# Patient Record
Sex: Female | Born: 1960 | Hispanic: No | Marital: Married | Smoking: Former smoker
Health system: Southern US, Community
[De-identification: ages and names within clinical notes are randomized; demographics above are authoritative.]

## PROBLEM LIST (undated history)

## (undated) DIAGNOSIS — Z21 Asymptomatic human immunodeficiency virus [HIV] infection status: Secondary | ICD-10-CM

## (undated) DIAGNOSIS — B20 Human immunodeficiency virus [HIV] disease: Secondary | ICD-10-CM

## (undated) DIAGNOSIS — R600 Localized edema: Secondary | ICD-10-CM

## (undated) DIAGNOSIS — F32A Depression, unspecified: Secondary | ICD-10-CM

## (undated) DIAGNOSIS — H8109 Meniere's disease, unspecified ear: Secondary | ICD-10-CM

## (undated) DIAGNOSIS — R609 Edema, unspecified: Secondary | ICD-10-CM

## (undated) DIAGNOSIS — E119 Type 2 diabetes mellitus without complications: Secondary | ICD-10-CM

## (undated) DIAGNOSIS — E7800 Pure hypercholesterolemia, unspecified: Secondary | ICD-10-CM

## (undated) HISTORY — PX: PACEMAKER PLACEMENT: SHX43

## (undated) HISTORY — PX: CATARACT EXTRACTION, BILATERAL: SHX1313

## (undated) HISTORY — DX: Pure hypercholesterolemia, unspecified: E78.00

## (undated) HISTORY — DX: Depression, unspecified: F32.A

## (undated) HISTORY — DX: Human immunodeficiency virus (HIV) disease: B20

## (undated) HISTORY — PX: UMBILICAL HERNIA REPAIR: SHX196

## (undated) HISTORY — DX: Meniere's disease, unspecified ear: H81.09

## (undated) HISTORY — DX: Localized edema: R60.0

## (undated) HISTORY — DX: Asymptomatic human immunodeficiency virus (hiv) infection status: Z21

## (undated) HISTORY — PX: OTHER SURGICAL HISTORY: SHX169

## (undated) HISTORY — DX: Edema, unspecified: R60.9

## (undated) HISTORY — PX: CHOLECYSTECTOMY: SHX55

## (undated) HISTORY — DX: Type 2 diabetes mellitus without complications: E11.9

---

## 2003-12-22 ENCOUNTER — Other Ambulatory Visit: Admission: RE | Admit: 2003-12-22 | Discharge: 2003-12-22 | Payer: Self-pay | Admitting: Family Medicine

## 2004-05-10 ENCOUNTER — Ambulatory Visit: Payer: Self-pay | Admitting: Family Medicine

## 2004-06-21 ENCOUNTER — Ambulatory Visit: Payer: Self-pay | Admitting: Family Medicine

## 2004-07-14 ENCOUNTER — Ambulatory Visit: Payer: Self-pay | Admitting: Family Medicine

## 2004-08-15 ENCOUNTER — Ambulatory Visit: Payer: Self-pay | Admitting: Family Medicine

## 2004-10-31 ENCOUNTER — Ambulatory Visit: Payer: Self-pay | Admitting: Family Medicine

## 2005-02-19 ENCOUNTER — Ambulatory Visit: Payer: Self-pay | Admitting: Family Medicine

## 2009-05-30 ENCOUNTER — Ambulatory Visit: Payer: Self-pay | Admitting: Surgery

## 2009-08-30 ENCOUNTER — Ambulatory Visit: Payer: Self-pay | Admitting: Vascular Surgery

## 2009-10-10 ENCOUNTER — Ambulatory Visit: Payer: Self-pay | Admitting: Vascular Surgery

## 2009-10-17 ENCOUNTER — Ambulatory Visit: Payer: Self-pay | Admitting: Vascular Surgery

## 2009-10-24 ENCOUNTER — Ambulatory Visit: Payer: Self-pay | Admitting: Vascular Surgery

## 2009-11-21 ENCOUNTER — Ambulatory Visit: Payer: Self-pay | Admitting: Vascular Surgery

## 2010-11-21 NOTE — Assessment & Plan Note (Signed)
OFFICE VISIT   MAYOLA, MCBAIN  DOB:  Mar 07, 1961                                       10/24/2009  CHART#:[id number]   This patient  had laser ablation of incompetent perforator (Cocketts) in  the lower third of the left leg which measured 0.62 cm in diameter; 192  joules of energy was used and post ablation ultrasound revealed no  evidence of DVT and no flow through the perforator with apparent  thrombosis.  She will return in 4 weeks for repeat venous duplex exam to  confirm closure.     Quita Skye Hart [place], M.D.  Electronically Signed   JDL/MEDQ  D:  10/24/2009  T:  10/25/2009  Job:  [id number]

## 2010-11-21 NOTE — Assessment & Plan Note (Signed)
OFFICE VISIT   Tiffany Rangel, Tiffany Rangel  DOB:  [birth year]                                       10/10/2009  EAVWU#:[id number]   Patient underwent laser ablation of her right great saphenous vein for  painful venous hypertension in the right leg.  She tolerated the  procedure well and will return in 1 week for a duplex ultrasound of her  right great saphenous system to check for closure.  She also will be  scheduled in the near future to have laser ablation of an incompetent  perforator in the lower third of the left leg, which is quite large and  causing pain and swelling.     Quita Skye Hart [place], M.D.  Electronically Signed   JDL/MEDQ  D:  10/10/2009  T:  10/11/2009  Job:  [id number]

## 2010-11-21 NOTE — Procedures (Signed)
DUPLEX DEEP VENOUS EXAM - LOWER EXTREMITY   INDICATION:  Follow up right greater saphenous vein ablation.   HISTORY:  Edema:  No.  Trauma/Surgery:  Right GSV laser ablation, 10/10/2009.  Pain:  No.  PE:  No.  Previous DVT:  No.  Anticoagulants:  No.  Other:   DUPLEX EXAM:                CFV   SFV   PopV  PTV    GSV                R  L  R  L  R  L  R   L  R  L  Thrombosis    o  o  o     o     o      +  Spontaneous   +  +  +     +     +      o  Phasic        +  +  +     +     +      o  Augmentation  +  +  +     +     +      o  Compressible  +  +  +     +     +      o  Competent     d  d  +     +     +      o   Legend:  + - yes  o - no  p - partial  D - decreased   IMPRESSION:  1. No evidence of deep venous thrombosis in the right lower extremity      or left common femoral vein.  2. Evidence of right great saphenous vein ablation without flow from      near saphenofemoral junction to mid thigh.  Right great saphenous      vein appears open, distal thigh; however, no significant flow      noted.           _____________________________  Quita Skye Hart [place], M.D.   AS/MEDQ  D:  10/17/2009  T:  10/18/2009  Job:  [id number]

## 2010-11-21 NOTE — Assessment & Plan Note (Signed)
OFFICE VISIT   Tiffany Rangel, Tiffany Rangel  DOB:  July 18, 1960                                       05/30/2009  CHART#:[id number]   REASON FOR VISIT:  Swelling.   CHIEF COMPLAINT:  Swelling.   HISTORY:  This is a 50 year old female that I am seeing at the request  of Dr. Sudie Bailey for evaluation of bilateral leg swelling.  The patient  states that she has been having problems for many years but it has  gotten worse over the last year.  Both legs swell equally.  Her biggest  complaints are up to the gaiter region.  Both ankle areas also get red  and she complains of itching which she says is relieved with the use of  Gold Bond Medicated ointment.  Her symptoms are helped or alleviated  with elevation and sleeping.  They are aggravated by prolonged standing.   The patient does suffer from diabetes since the age of 67.  She does not  require the use of insulin.  She also suffers from Meniere's disease  which is under adequate control.   REVIEW OF SYSTEMS:  Positive for reflux, constipation, arthritis, joint  pain, muscle pain, anxiety and bilateral cataracts.  All others are  negative as documented in the encounter form.   PAST MEDICAL HISTORY:  Diabetes, Meniere's disease, lower extremity  edema, hyperlipidemia, gastroesophageal reflux disease, anxiety.   PAST SURGICAL HISTORY:  C-section x3, left ear shunt, double hernia,  gallbladder surgery and deviated septum repair.   FAMILY HISTORY:  Positive for cardiovascular disease at an early age in  her father.   SOCIAL HISTORY:  She is married with 3 children.  She works as a  housewife.  She does not smoke.  She quit in 1996.  She does not drink  alcohol.   ALLERGIES:  None.   PHYSICAL EXAMINATION:  Heart rate 79, blood pressure 111/76,  temperature 98.0.  General:  She is well-appearing in no distress.  Head:  Normocephalic, atraumatic.  ENT:  Pupils equal.  Sclerae  anicteric.  Neck:  Supple.  Lungs:  Clear  bilaterally.  Cardiovascular:  Regular rate and rhythm.  No murmurs.  Palpable pedal pulses.  GI:  Abdomen is soft, obese.  No hepatosplenomegaly.  Neurology:  No focal  deficits.  Musculoskeletal:  No major deformities.  Skin:  No rashes.  Extremities:  No ulceration, minimal edema at this time.  Extremities  are well-perfused.   DIAGNOSTIC STUDIES:  I have independently reviewed her ultrasound.  This  reveals a competent deep system bilaterally.  She has reflux in her  right greater saphenous vein and an incompetent perforator in the left  down in the ankle.   ASSESSMENT/PLAN:  Bilateral swelling secondary to venous insufficiency.   PLAN:  The patient will be fitted for compression stockings today. We  will schedule her to come back in 3 months and we will discuss laser  ablation.   Jorge Ny, MD  Electronically Signed   VWB/MEDQ  D:  05/30/2009  T:  05/31/2009  Job:  [id number]

## 2010-11-21 NOTE — Procedures (Signed)
DUPLEX DEEP VENOUS EXAM - LOWER EXTREMITY   INDICATION:  Follow up left lower leg perforator vein laser ablation.   HISTORY:  Edema:  Bilateral lower extremities.  Trauma/Surgery:  Right greater saphenous vein laser ablation on  10/10/2009, left lower leg perforator vein laser ablation on 10/24/2009.  Pain:  No.  PE:  No.  Previous DVT:  No.  Anticoagulants:  Other:   DUPLEX EXAM:                CFV   SFV   PopV  PTV    GSV                R  L  R  L  R  L  R   L  R  L  Thrombosis    o  o     o     o      o     o  Spontaneous   +  +     +     +      +     +  Phasic        +  +     +     +      +     +  Augmentation  +  +     +     +      +     +  Compressible  +  +     +     +      +     +  Competent     +  +     +     +      +     +   Legend:  + - yes  o - no  p - partial  D - decreased   IMPRESSION:  1. No evidence of deep venous thrombosis noted in the left lower      extremity.  2. Patent left lower leg perforator vein noted extending off of the      left posterior tibial vein with continuous reversed flow noted.  3. No reflux was noted in the left lower extremity deep or saphenous      vein systems.    _____________________________  Quita Skye Hart [place], M.D.   CH/MEDQ  D:  11/21/2009  T:  11/21/2009  Job:  [id number]

## 2010-11-21 NOTE — Assessment & Plan Note (Signed)
OFFICE VISIT   Tiffany Rangel, Tiffany Rangel  DOB:  12/07/1960                                       10/17/2009  JYNWG#:[id number]   The patient returns 1 week post laser ablation of her right great  saphenous vein for pain and swelling in the right leg secondary to  venous hypertension.  She has noticed decrease in the swelling in her  ankle already since the procedure was done 1 week ago.  She had a mild  to moderate discomfort in the proximal thigh as one would expect that  this has subsided with her ibuprofen.  She is continuing to wear long-  leg elastic compression stocking as instructed.   PHYSICAL EXAMINATION:  VITAL SIGNS:  Blood pressure 119/75, heart rate  76, respirations 18.  Her right leg has some mild tenderness in the  proximal thigh over the saphenous vein.  There is some mild ecchymosis  in this area.  There is no edema in the right ankle.  Left leg continues  to have 1 to 2 plus edema distally.   Venous duplex exam was performed today which revealed no evidence of  deep venous obstruction with total closure of the right great saphenous  vein from the mid thigh to the saphenofemoral junction.   I think she is doing well from the standpoint of her right leg  procedure.  She will continue wearing elastic stocking for 1 week.  She  will return on April 18 for laser ablation of the incompetent perforator  distal left leg for venous hypertension.     Quita Skye Hart [place], M.D.  Electronically Signed   JDL/MEDQ  D:  10/17/2009  T:  10/18/2009  Job:  [id number]

## 2010-11-21 NOTE — Assessment & Plan Note (Signed)
OFFICE VISIT   MARISAL, SWAREY  DOB:  September 03, 1960                                       08/30/2009  EAVWU#:[id number]   The patient returns today for continued followup regarding her severe  venous insufficiency causing bilateral lower extremity swelling.  She  was evaluated by Dr. Myra Gianotti in November of this year and she was found  to have gross reflux in her right great saphenous vein from the  saphenofemoral junction to the knee as well as a large incompetent  perforator the lower third of the left leg which is an area of severe  symptomatology in the left leg.  She has severe swelling and pretibial  burning and pain as the day progresses the more she is on her feet.  This is improved by elevation but not completely relieved.  She has worn  long leg elastic compression stockings (20 mm - 30 mm gradient) over the  past 3 months with no improvement in symptoms and exacerbation of her  pain.  She has also tried analgesics (ibuprofen).   On examination today she continues to have some edema in both legs  particularly from the mid calf distally with some mild erythema in these  areas and tightness of the skin.  There are no bulging varicosities.  No  active ulcerations.  No infection.   I visualized her veins today with the SonoSite and confirmed the  findings in the vascular lab.  I think she would be best treated by the  following procedures:  1. Laser ablation of the right great saphenous vein.  2. Laser ablation of the left incompetent perforator in the lower      third of the leg which is 0.62 cm in diameter.   Today blood pressure 114/81, heart rate is 84, temperature 97.6.  Physical findings are as noted.  We will proceed with precertification  to perform this in the near future.     Quita Skye Hart [place], M.D.  Electronically Signed   JDL/MEDQ  D:  08/30/2009  T:  08/31/2009  Job:  [id number]

## 2010-11-21 NOTE — Assessment & Plan Note (Signed)
OFFICE VISIT   Tiffany, Rangel  DOB:  05-Aug-1960                                       11/21/2009  JXBJY#:[id number]   Patient returns today 4 weeks post laser ablation of an incompetent  perforating branch in the distal third of the left leg (0.62 cm in  diameter) which was performed on April 18th.  She had been having  swelling in the left leg as well as aching and throbbing and heavy  discomfort and hypersensitivity.  Since the procedure, she states her  leg is much less hypersensitive, does not ache and throb as much but  does continue to swell.  Her swelling in the right leg has persisted  following laser ablation of the right great saphenous vein but the  aching, throbbing discomfort has resolved.   She denies any chest pain, dyspnea on exertion, PND, orthopnea, chronic  anorexia, no asthma, wheezing, chronic bronchitis.  Denies any other  specific symptoms.   PHYSICAL EXAMINATION:  Blood pressure 125/84, heart rate 91,  respirations 24.  Generally, she is a female patient who is alert,  oriented x3.  She is in no apparent distress.  Neck is supple, 2+  carotid pulses.  Lower extremity exam reveals 3+ femoral, popliteal, and  dorsalis pedis pulses bilaterally.  Right leg has 0 to 1+ edema.  Left  leg has 1+ edema.  There are no stasis ulcers, hyperpigmentation,  ulceration, or varicosities noted.   Today I ordered a venous duplex exam of the left leg, which I have  reviewed and interpreted.  There is no deep venous thrombosis, but there  is patency of the incompetent perforating branch in the lower third of  the leg (Cockett's), which was treated 4 weeks ago.   There is no reflux in the left great saphenous system.   I discussed these findings with patient and do not recommend repeating  the laser treatment at this point unless her symptoms worsen.  If that  is the case, she will continue to wear long-leg elastic compression  stockings and try  all conservative measures such as elevation and  ibuprofen, and if this persists, she will return.  We may need to  attempt closure on another occasion in the left leg, otherwise will see  her on a p.r.n. basis.     Quita Skye Hart [place], M.D.  Electronically Signed   JDL/MEDQ  D:  11/21/2009  T:  11/22/2009  Job:  [id number]

## 2010-11-21 NOTE — Procedures (Signed)
LOWER EXTREMITY VENOUS REFLUX EXAM   INDICATION:  Bilateral swelling.   EXAM:  Using color-flow imaging and pulse Doppler spectral analysis, the  bilateral common femoral, superficial femoral, popliteal, posterior  tibial, greater and lesser saphenous veins are evaluated.  There is no  evidence suggesting deep venous insufficiency in the bilateral lower  extremities.   The right saphenofemoral junction is not competent with Reflux of  >527milliseconds. The right GSV is not competent with Reflux of  >570milliseconds with the caliber as described below.   The bilateral proximal short saphenous veins demonstrate competency.   GSV Diameter (used if found to be incompetent only)                                            Right    Left  Proximal Greater Saphenous Vein           0.83 cm  cm  Proximal-to-mid-thigh                     0.78 cm  cm  Mid thigh                                 0.47 cm  cm  Mid-distal thigh                          cm       cm  Distal thigh                              0.30 cm  cm  Knee                                      0.31 cm  cm   IMPRESSION:  1. Right greater saphenous vein Reflux with >563milliseconds is      identified with the caliber ranging from 0.31 cm to 0.83 cm knee to      groin.  2. The right greater saphenous vein is not aneurysmal.  3. The right greater saphenous vein is not tortuous.  4. The deep venous system is competent.  5. There is an incompetent perforator identified at the left distal      calf measuring 0.62 cm.          ___________________________________________  V. Charlena Cross, MD   CJ/MEDQ  D:  05/30/2009  T:  05/31/2009  Job:  [id number]

## 2011-04-26 ENCOUNTER — Other Ambulatory Visit: Payer: Self-pay | Admitting: Orthopedic Surgery

## 2011-04-26 ENCOUNTER — Encounter (HOSPITAL_COMMUNITY): Payer: PRIVATE HEALTH INSURANCE

## 2011-04-26 LAB — CBC
MCH: 27.5 pg (ref 26.0–34.0)
MCHC: 32.1 g/dL (ref 30.0–36.0)
MCV: 85.6 fL (ref 78.0–100.0)
Platelets: 442 10*3/uL — ABNORMAL HIGH (ref 150–400)
RBC: 4.51 MIL/uL (ref 3.87–5.11)

## 2011-04-26 LAB — URINALYSIS, ROUTINE W REFLEX MICROSCOPIC
Bilirubin Urine: NEGATIVE
Glucose, UA: NEGATIVE mg/dL
Hgb urine dipstick: NEGATIVE
Protein, ur: NEGATIVE mg/dL
Specific Gravity, Urine: 1.016 (ref 1.005–1.030)

## 2011-04-26 LAB — DIFFERENTIAL
Basophils Relative: 0 % (ref 0–1)
Eosinophils Absolute: 0.2 10*3/uL (ref 0.0–0.7)
Eosinophils Relative: 1 % (ref 0–5)
Lymphs Abs: 3.7 10*3/uL (ref 0.7–4.0)
Neutrophils Relative %: 60 % (ref 43–77)

## 2011-04-26 LAB — BASIC METABOLIC PANEL
Chloride: 96 mEq/L (ref 96–112)
GFR calc Af Amer: >90 mL/min (ref >90)
GFR calc non Af Amer: >90 mL/min (ref >90)
Potassium: 3.5 mEq/L (ref 3.5–5.1)
Sodium: 137 mEq/L (ref 135–145)

## 2011-04-26 LAB — URINE MICROSCOPIC-ADD ON

## 2011-04-26 LAB — PROTIME-INR: Prothrombin Time: 13.1 seconds (ref 11.6–15.2)

## 2011-04-26 LAB — SURGICAL PCR SCREEN: Staphylococcus aureus: NEGATIVE

## 2011-04-26 LAB — ABO/RH: ABO/RH(D): O POS

## 2011-04-30 NOTE — H&P (Signed)
Tiffany Rangel, Tiffany Rangel             ACCOUNT NO.:  [id number]  MEDICAL RECORD NO.:  [id number]  LOCATION:                               FACILITY:  [facility]  PHYSICIAN:  Madlyn Frankel. Charlann Boxer, M.D.  DATE OF BIRTH:  [birth year]  DATE OF ADMISSION:  05/01/2011 DATE OF DISCHARGE:                             HISTORY & PHYSICAL   DATE OF SURGERY:  05/01/2011  ADMITTING DIAGNOSIS:  Osteoarthritis, left hip.  HISTORY OF PRESENT ILLNESS:  This is a 50 year old lady with a history of osteoarthritis of the left hip with failure of conservative treatment to alleviate her pain.  After discussion of treatments, benefits, risks, and options, the patient is now scheduled for total hip arthroplasty by anterior approach of her left hip.  Note that she is a candidate for tranexamic acid will receive that at surgery.  Her medical doctor is Dr. Sudie Bailey.  She will be going home after surgery.  She is given her postop medicines of aspirin, iron, MiraLax,  Colace, and Robaxin.  PAST MEDICAL HISTORY:  Drug allergies:  None.  CURRENT MEDICATIONS: 1. Diazepam 5 mg 1 q.6 p.r.n. 2. Oxycodone and acetaminophen 1 tablet q.h.s. p.r.n. pain. 3. Vitamin D 3000 units once a day. 4. Calcium 600 mg once a day. 5. Citalopram 20 mg once a day. 6. Triamterene 37.5 mg/hydrochlorothiazide 25 mg 1 daily. 7. Nexium 40 mg 1 daily.  MEDICAL ILLNESSES:  Include hypertension, diet-controlled diabetes, reflux, vertigo, anxiety.  PREVIOUS SURGERIES:  Include C-section x3, double herniorrhaphy, cholecystectomy, left ear shunt, deviated septum repair,  and cataracts in both eyes.  FAMILY HISTORY:  Positive for congestive heart failure, breast cancer, and hypertension.  SOCIAL HISTORY:  The patient is married.  She is a housewife.  She does not smoke and does not drink.  REVIEW OF SYSTEMS:  CENTRAL NERVOUS SYSTEM:  Positive for vertigo and anxiety.  PULMONARY:  Negative for shortness of breath, PND, orthopnea. CARDIOVASCULAR:   Negative chest pain or palpitation.  GI:  Negative for ulcers, hepatitis.  Positive for reflux.  GU:  Negative for urinary tract difficulty.  MUSCULOSKELETAL:  Positive as in HPI.  PHYSICAL EXAMINATION:  VITAL SIGNS:  BP 132/76, respirations 16, pulse 84 and regular. GENERAL APPEARANCE:  She is well-developed, well-nourished lady, in no acute distress. HEENT:  Head, normocephalic.  Nose patent.  Ears patent.  Pupils equal, round, react to light.  Throat without injection. NECK:  Supple without adenopathy.  Carotids 2+ without bruit. CHEST:  Clear to auscultation.  No rales or rhonchi.  Respirations 16. HEART:  Regular rate and rhythm at 84 beats per minute without murmur. ABDOMEN:  Soft, active bowel sounds.  No masses or organomegaly. NEUROLOGIC:  The patient alert and oriented to time, place, and person. Cranial nerves II through XII grossly intact. EXTREMITIES:  Shows left hip with decreased range of motion with pain. NEUROVASCULAR:  Status intact.  IMPRESSION:  Osteoarthritis, left hip.  PLAN:  Total hip arthroplasty by anterior approach, left hip.     Jaquelyn Bitter. Chabon, P.A.   ______________________________ Madlyn Frankel Charlann Boxer, M.D.    SJC/MEDQ  D:  04/25/2011  T:  04/25/2011  Job:  [id number]  Electronically Signed by Jeannett Senior  CHABON P.A. on 04/26/2011 04:58:51 PM Electronically Signed by Durene Romans M.D. on 04/30/2011 12:41:10 PM

## 2011-05-01 ENCOUNTER — Inpatient Hospital Stay (HOSPITAL_COMMUNITY): Payer: PRIVATE HEALTH INSURANCE

## 2011-05-01 ENCOUNTER — Inpatient Hospital Stay (HOSPITAL_COMMUNITY)
Admission: RE | Admit: 2011-05-01 | Discharge: 2011-05-03 | DRG: 470 | Disposition: A | Discharge status: Home Health | Payer: PRIVATE HEALTH INSURANCE | Source: Ambulatory Visit | Attending: Orthopedic Surgery | Admitting: Orthopedic Surgery

## 2011-05-01 DIAGNOSIS — M169 Osteoarthritis of hip, unspecified: Principal | ICD-10-CM | POA: Diagnosis present

## 2011-05-01 DIAGNOSIS — K219 Gastro-esophageal reflux disease without esophagitis: Secondary | ICD-10-CM | POA: Diagnosis present

## 2011-05-01 DIAGNOSIS — G4733 Obstructive sleep apnea (adult) (pediatric): Secondary | ICD-10-CM | POA: Diagnosis present

## 2011-05-01 DIAGNOSIS — I10 Essential (primary) hypertension: Secondary | ICD-10-CM | POA: Diagnosis present

## 2011-05-01 DIAGNOSIS — Z79899 Other long term (current) drug therapy: Secondary | ICD-10-CM

## 2011-05-01 DIAGNOSIS — Z01812 Encounter for preprocedural laboratory examination: Secondary | ICD-10-CM

## 2011-05-01 DIAGNOSIS — R42 Dizziness and giddiness: Secondary | ICD-10-CM | POA: Diagnosis present

## 2011-05-01 DIAGNOSIS — F411 Generalized anxiety disorder: Secondary | ICD-10-CM | POA: Diagnosis present

## 2011-05-01 DIAGNOSIS — M1610 Unilateral primary osteoarthritis, unspecified hip: Principal | ICD-10-CM | POA: Diagnosis present

## 2011-05-01 DIAGNOSIS — E119 Type 2 diabetes mellitus without complications: Secondary | ICD-10-CM | POA: Diagnosis present

## 2011-05-01 LAB — TYPE AND SCREEN
ABO/RH(D): O POS
Antibody Screen: NEGATIVE

## 2011-05-01 LAB — GLUCOSE, CAPILLARY: Glucose-Capillary: 123 mg/dL — ABNORMAL HIGH (ref 70–99)

## 2011-05-02 LAB — CBC
MCH: 28 pg (ref 26.0–34.0)
Platelets: 314 10*3/uL (ref 150–400)
RBC: 3.47 MIL/uL — ABNORMAL LOW (ref 3.87–5.11)
WBC: 10.4 10*3/uL (ref 4.0–10.5)

## 2011-05-02 LAB — BASIC METABOLIC PANEL
CO2: 31 mEq/L (ref 19–32)
Calcium: 8.8 mg/dL (ref 8.4–10.5)
GFR calc Af Amer: >90 mL/min (ref >90)
Sodium: 136 mEq/L (ref 135–145)

## 2011-05-03 LAB — CBC
MCV: 87.4 fL (ref 78.0–100.0)
Platelets: 331 10*3/uL (ref 150–400)
RBC: 3.5 MIL/uL — ABNORMAL LOW (ref 3.87–5.11)
WBC: 11.5 10*3/uL — ABNORMAL HIGH (ref 4.0–10.5)

## 2011-05-03 LAB — BASIC METABOLIC PANEL
CO2: 34 mEq/L — ABNORMAL HIGH (ref 19–32)
Chloride: 95 mEq/L — ABNORMAL LOW (ref 96–112)
Creatinine, Ser: 0.67 mg/dL (ref 0.50–1.10)
Potassium: 3.2 mEq/L — ABNORMAL LOW (ref 3.5–5.1)
Sodium: 136 mEq/L (ref 135–145)

## 2011-05-07 NOTE — Op Note (Signed)
Tiffany Rangel             ACCOUNT NO.:  [id number]  MEDICAL RECORD NO.:  [id number]  LOCATION:  1604                         FACILITY:  [facility]  PHYSICIAN:  Tiffany Frankel. Charlann Rangel, M.D.  DATE OF BIRTH:  Jul 07, 1961  DATE OF PROCEDURE:  05/01/2011 DATE OF DISCHARGE:                              OPERATIVE REPORT   PREOPERATIVE DIAGNOSIS:  Left hip osteoarthritis.  POSTOPERATIVE DIAGNOSIS:  Left hip osteoarthritis.  PROCEDURE:  Left total hip replacement through an anterior approach utilizing DePuy component, size 50 pinnacle cup, 32+ 1 neutral Ultrex liner, a size 4 high Tri-Lock stem high with a 32+ 1.5 delta ceramic ball.  SURGEON:  Tiffany Frankel. Charlann Rangel, M.D.  ASSISTANT:  Lanney Gins, PA  Please note that Lanney Gins, physician assistant was present for the entire case with preoperative positioning to perioperative retractor management and support in addition to general facilitation of the case and primary wound closure. ANESTHESIA:  General.  SPECIMENS:  None.  BLOOD LOSS:  450 cc.  DRAINS:  One Hemovac.  COMPLICATION:  None.  The patient was stable to recovery room in good condition.  INDICATION FOR PROCEDURE:  Tiffany Rangel is a 50 year old female, who presents office for left hip pain.  Radiographs revealed that she had end-stage bone-on-bone changes.  She had failed to respond conservative measures.  After reviewing risks and benefits of the procedure, she wished to proceed with more definitive measures.  Specific risk of infection, DVT, component failure, dislocation, need for revision surgery all discussed and reviewed.  Consent was obtained for the benefit of pain relief.  Anterior approach was chosen after reviewing the pros and cons of this versus posterior approach.  PROCEDURE IN DETAIL:  The patient was brought to operative theater. Once adequate anesthesia, preoperative antibiotics, Ancef administered, the patient was positioned supine on the OSI  Hana table.  Once adequately positioned with the left arm adducted across her body and her abdomen taped to the right side to allow her adequate exposure, the left hip was pre-draped out with 10-15 drapes.  Fluoroscopy was used to confirm orientation of pelvis and positioning.  The left hip from proximal to the iliac crest to the mid thigh was prepped and draped in a sterile fashion.  Landmarks were identified, particularly the anterior and superior iliac spine and the greater trochanter.  Time-out was performed identifying the patient, planned procedure, and extremity.  An incision was then made 2 cm distal and lateral to the anterior superior iliac spine extending in line and orientation of the tensor fascia lata muscle.  Sharp dissection was carried down to the tensor fascia lata muscle.  Protractor was replaced.  Fascia was then incised. Muscle belly was swept laterally and retractor placed along the superior neck.  Following cauterization of circumflex vessels and as well as removing pericapsular fat, a second retractor placed along the inferior neck.  A third retractor was placed underneath the anterior rectus over the anterior wall of acetabulum.  A capsulotomy was then made along the superior neck extending the trochanteric fossa and then down toward the lesser trochanter.  Stay sutures were placed and retractors placed intracapsular.  At this point, traction was applied.  Fluoroscopy was used  to confirm the orientation and the length of the neck cut.  The neck osteotomy was then made and the femoral head removed without difficulty or complication.  At this point, the traction was removed and retractor placed along the posterior acetabulum and then anteriorly after removing all of the remaining foveal and soft tissues.  They were invaginated into the acetabulum.  I began reaming with a 44 reamer and reamed up to a 49 reamer.  The last reaming was done under fluoroscopy  to confirm the depth of reaming and orientation.  The final 50 cup was chosen and impacted under fluoroscopic imaging to confirm the amount of abduction.  Palpably, the acetabular splint was positioned anatomically.  A single cancellous screws were placed in the ilium.  A hole eliminator was placed.  The final 32+ 4 liner was then impacted.  At this point, the hip was rolled to 80 degrees.  The capsule was removed off the inferior neck.  The lateral hook was placed and manual traction lifted the femur up and the bed held into position.  The leg was then extended and adducted.  A second retractor placed over the posterior trochanter pushing the gluteus away.  Once the proximal femur was exposed posteriorly box osteotome was used to set orientation followed by began broaching with a starting broach by hand.  I then broached up to a size 3 initially.  I did a trial reduction with a standard neck initially.  A trial reduction revealed that probably it would go up 1 size in broach.  In addition, it appeared that had less offset near the side with a standard neck.  Leg lengths, however, appeared to be very close.  Given these findings, I replaced retractors in the lateral hook, dislocated the hip, removed the trial components and broached up to a size 4.  I chose a 4 high Tri-Lock stem. Now, this final stem was then impacted.  A trial reduction was carried out with 32+ 1 ball.  With this, the leg lengths were right on comparing the ischium to the lesser trochanter and in addition, offset that was improved.  I chose a 32+ 1 delta ceramic ball, which was impacted onto clean and dry trunnion and the hip was reduced.  The hip had been irrigated throughout the case and again this point.  I reapproximated the capsule over the anterior aspect the hip.  A medium Hemovac drain was placed deep.  The fascia of the tensor fascia lata muscle was then reapproximated using #1 Vicryl.  The remaining  wound was closed with 2-0 Vicryl and running 4-0 Monocryl.  The hip was then cleaned, dried, and dressed sterilely using Dermabond and Aquacel dressing.  Drain site dressed separately.  The patient was then brought to the recovery room in stable condition, tolerating the procedure well.     Tiffany Rangel, M.D.     MDO/MEDQ  D:  05/01/2011  T:  05/02/2011  Job:  [id number]  cc:   Philemon Kingdom, MD Fax: [phone]  Electronically Signed by Durene Romans M.D. on 05/07/2011 09:15:25 AM

## 2011-05-11 NOTE — Discharge Summary (Signed)
Tiffany Rangel, Tiffany Rangel             ACCOUNT NO.:  [id number]  MEDICAL RECORD NO.:  [id number]  LOCATION:  1604                         FACILITY:  [facility]  PHYSICIAN:  Lanney Gins, PA     DATE OF BIRTH:  February 09, 1961  DATE OF ADMISSION:  05/01/2011 DATE OF DISCHARGE:  05/03/2011                              DISCHARGE SUMMARY   PROCEDURE:  Left total hip arthroplasty anterior approach.  ATTENDING PHYSICIAN:  Madlyn Frankel. Charlann Boxer, M.D.  ADMITTING DIAGNOSIS:  Osteoarthritis, left hip.  DISCHARGE DIAGNOSES: 1. Status post left total hip arthroplasty, anterior approach. 2. Hypertension. 3. Diet-controlled diabetes. 4. Reflux. 5. Vertigo. 6. Anxiety.  HISTORY OF PRESENT ILLNESS:  The patient is a 50 year old lady with a history of osteoarthritis of the left knee with failure of conservative treatment to alleviate her pain.  X-rays in the clinic do show arthritic changes of the left hip.  Various options were discussed with the patient.  The patient wished to proceed with surgery.  Risks, benefits, and expectation of procedure were discussed with the patient.  The patient understands risks, benefits, and expectations and wishes to proceed with left total hip arthroplasty per Dr. Charlann Boxer.  HOSPITAL COURSE:  The patient underwent the above-stated procedure on May 01, 2011.  The patient tolerated the procedure well, was brought to the recovery room in good condition and subsequently to the floor.  Postop day #1, May 02, 2011, the patient doing well.  No events. Pain okay, controlled with medicines.  Afebrile.  Vital signs stable.  H and H 9.7/30.1.  She is distally neurovascular intact.  Dressing is good.  The patient did well with physical therapy.  Hemovac drain was taken out.  IV was changed to saline lock.  Postop day #2, May 03, 2011, the patient doing well.  No events. Pain is well controlled with pain medicine.  She is afebrile.  Vital signs stable.  H and H are  9.7/30.6.  Dressings good.  The patient did well with physical therapy x2.  It was felt that the patient was doing well enough to be discharged home.  DISCHARGE CONDITION:  Good.  DISCHARGE INSTRUCTIONS:  The patient will be discharged home with home health PT and OT.  The patient will be weightbearing as tolerated.  She will maintain her surgical dressing for about 8 days, after which time, she will replace with gauze and tape.  The patient keep the area dry and clean until followup.  The patient will follow up at [facility] in 2 weeks.  The patient is to call with any questions or concerns.  DISCHARGE MEDICATIONS: 1. Aspirin enteric-coated 325 mg 1 p.o. b.i.d. x4 weeks. 2. Tylenol 325 mg 1-2 p.o. q.4 hours p.r.n. pain. 3. Benadryl 25 mg 1 p.o. q.4 hours p.r.n. 4. Colace 100 mg 1 p.o. b.i.d. constipation. 5. Iron sulfate 325 mg 1 p.o. t.i.d. x2-3 weeks. 6. Robaxin 500 mg 1 p.o. every 6 hours p.r.n. muscle spasm. 7. Oxycodone 5 mg IR 1-3 p.o. q.4-6 hours p.r.n. pain. 8. MiraLax 17 g p.o. b.i.d. constipation. 9. Calcium carbonate 600 mg p.o. daily. 10.Diazepam 5 mg 1 p.o. daily p.r.n. anxiety. 11.Lexapro 20 mg 1 p.o. q.a.m. 12.Multivitamin 1 p.o.  daily. 13.Nexium 40 mg 1 p.o. daily. 14.Triamterene/HCTZ 37.5/25 mg 1 p.o. q.a.m. 15.Vitamin D3 1000 units 1 p.o. daily.          ______________________________ Lanney Gins, PA     MB/MEDQ  D:  05/03/2011  T:  05/03/2011  Job:  [id number]  Electronically Signed by Lanney Gins PA on 05/08/2011 02:16:01 PM Electronically Signed by Durene Romans M.D. on 05/11/2011 04:07:58 PM

## 2013-06-21 IMAGING — CR DG HIP 1V PORT*L*
1 series · 1 of 1 positions shown · non-contrast
Comparison: Portable cross-table lateral view 9007 hours compared
to portable pelvic radiograph 05/01/2011

CLINICAL DATA: Left total hip arthroplasty

PORTABLE LEFT HIP - 1 VIEW

[AP]
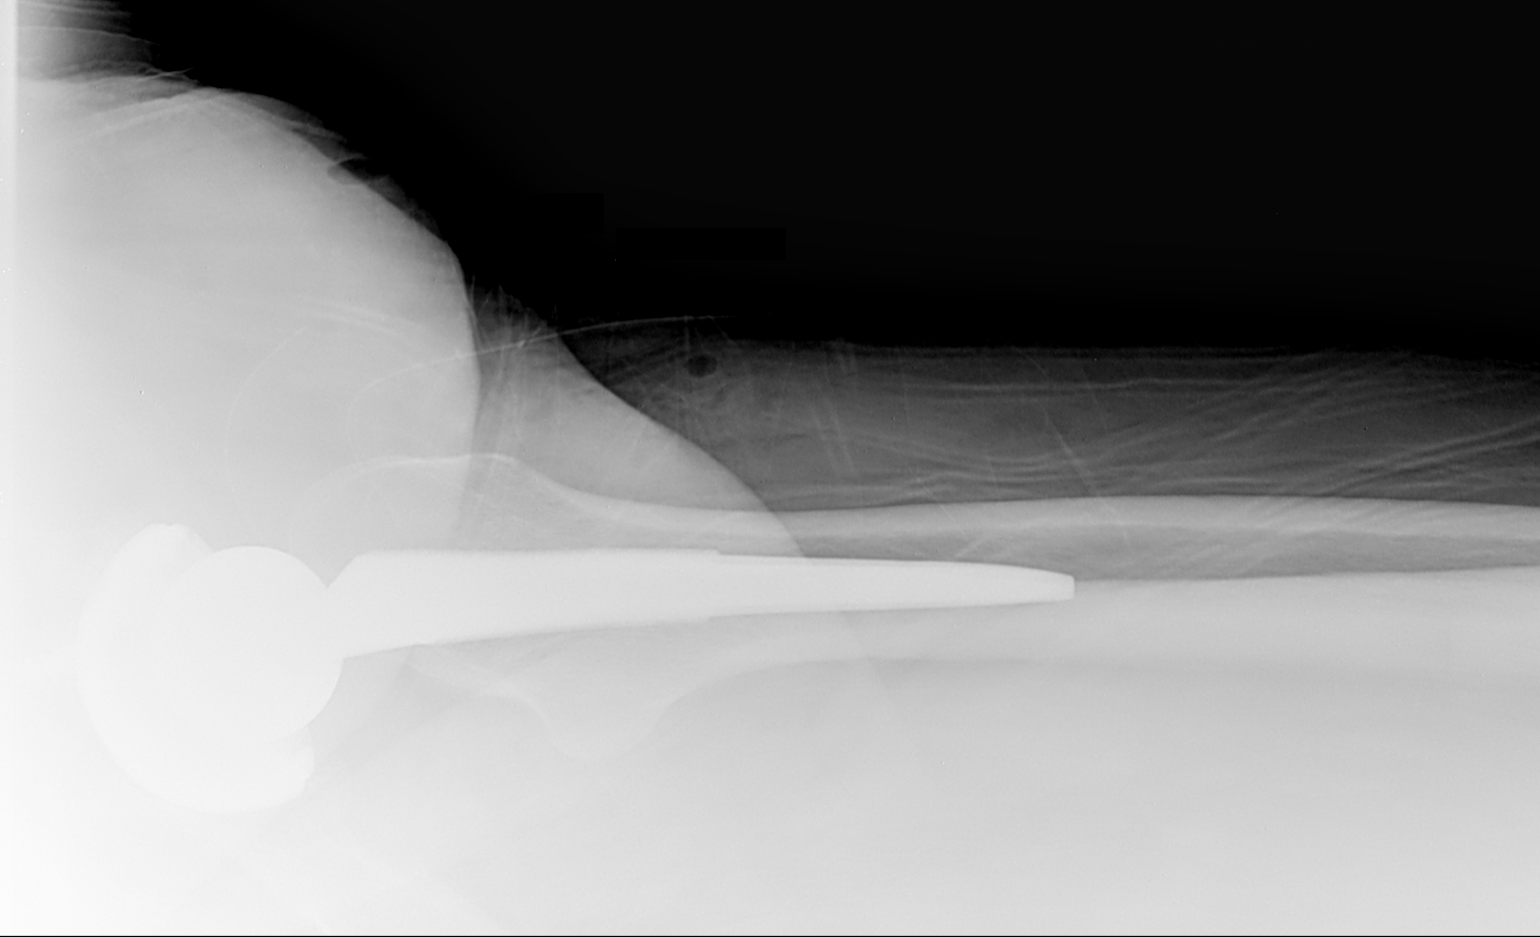

[1 of 1 positions shown; findings below may reference images not displayed]

FINDINGS: Components of left hip arthroplasty are identified in expected
positions.
Acetabulum and pelvis are poorly visualized on this cross-table
lateral exam due to light technique at the pelvis.
No acute fracture, dislocation or bone destruction.
Anterior surgical drain identified.
IMPRESSION: Left hip prosthesis without acute complication.

## 2013-06-21 IMAGING — CR DG PORTABLE PELVIS
1 series · 1 of 1 positions shown · non-contrast
Comparison: None.

CLINICAL DATA: Left total hip replacement

PORTABLE PELVIS

[AP]
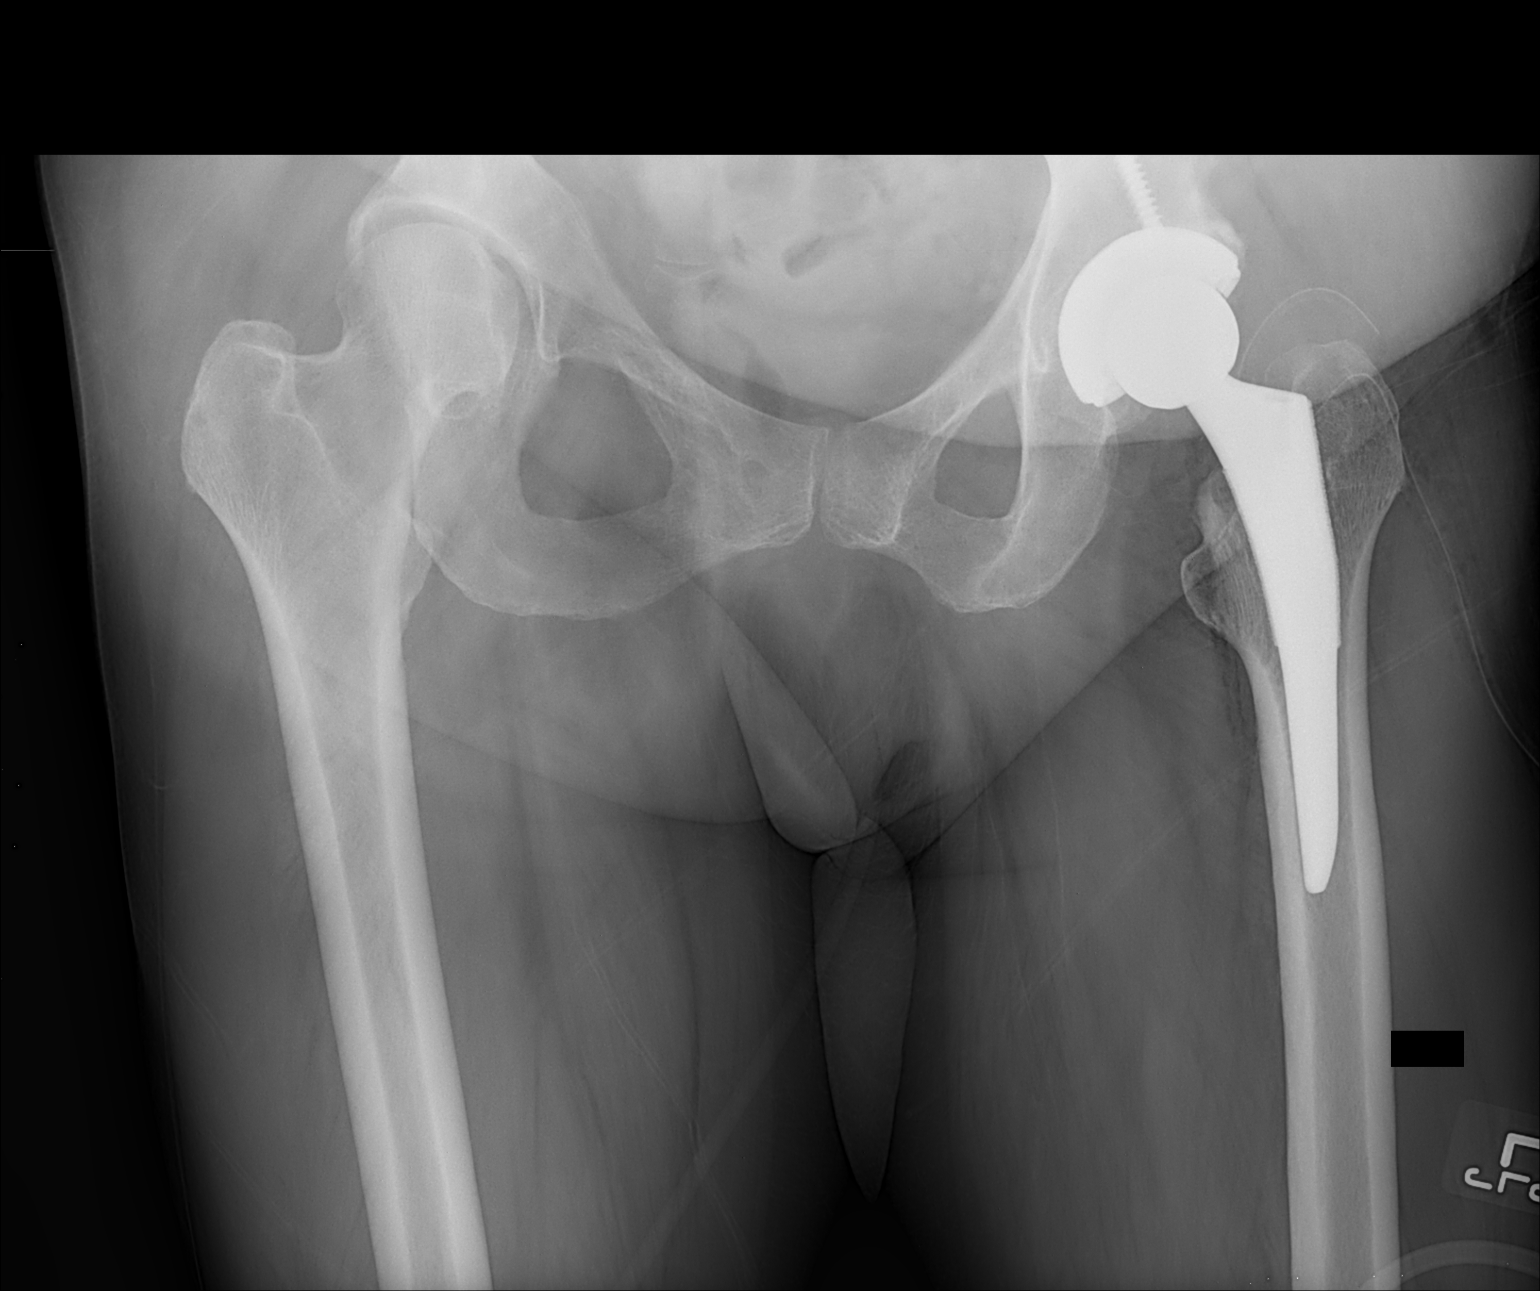

[1 of 1 positions shown; findings below may reference images not displayed]

FINDINGS: Left total hip arthroplasty noted.  The femoral component
and acetabular component appear well seated.  Surgical drain in
place.  There is narrowing of the right hip joint space.
IMPRESSION: Left hip total arthroplasty without complication.

## 2013-07-22 DIAGNOSIS — B20 Human immunodeficiency virus [HIV] disease: Secondary | ICD-10-CM

## 2013-08-28 DIAGNOSIS — B20 Human immunodeficiency virus [HIV] disease: Secondary | ICD-10-CM

## 2013-09-23 DIAGNOSIS — B20 Human immunodeficiency virus [HIV] disease: Secondary | ICD-10-CM

## 2013-11-02 DIAGNOSIS — B20 Human immunodeficiency virus [HIV] disease: Secondary | ICD-10-CM

## 2014-02-04 DIAGNOSIS — B20 Human immunodeficiency virus [HIV] disease: Secondary | ICD-10-CM

## 2014-10-15 ENCOUNTER — Other Ambulatory Visit: Payer: Self-pay | Admitting: Internal Medicine

## 2014-12-10 DIAGNOSIS — B20 Human immunodeficiency virus [HIV] disease: Secondary | ICD-10-CM | POA: Diagnosis not present

## 2015-05-20 DIAGNOSIS — B20 Human immunodeficiency virus [HIV] disease: Secondary | ICD-10-CM | POA: Diagnosis not present

## 2015-07-03 ENCOUNTER — Other Ambulatory Visit: Payer: Self-pay | Admitting: Infectious Diseases

## 2015-07-13 ENCOUNTER — Other Ambulatory Visit: Payer: Self-pay | Admitting: Infectious Diseases

## 2016-01-04 ENCOUNTER — Other Ambulatory Visit: Payer: Self-pay | Admitting: Infectious Diseases

## 2016-01-11 ENCOUNTER — Other Ambulatory Visit: Payer: Self-pay | Admitting: Infectious Diseases

## 2016-07-08 ENCOUNTER — Other Ambulatory Visit: Payer: Self-pay | Admitting: Infectious Diseases

## 2016-07-16 ENCOUNTER — Other Ambulatory Visit: Payer: Self-pay | Admitting: Infectious Diseases

## 2016-10-11 ENCOUNTER — Other Ambulatory Visit: Payer: Self-pay | Admitting: Infectious Diseases

## 2018-07-28 DIAGNOSIS — I10 Essential (primary) hypertension: Secondary | ICD-10-CM

## 2018-07-28 DIAGNOSIS — R7989 Other specified abnormal findings of blood chemistry: Secondary | ICD-10-CM

## 2018-07-28 DIAGNOSIS — Z950 Presence of cardiac pacemaker: Secondary | ICD-10-CM

## 2018-07-28 DIAGNOSIS — E119 Type 2 diabetes mellitus without complications: Secondary | ICD-10-CM

## 2018-07-28 DIAGNOSIS — I442 Atrioventricular block, complete: Secondary | ICD-10-CM

## 2018-07-28 DIAGNOSIS — K219 Gastro-esophageal reflux disease without esophagitis: Secondary | ICD-10-CM

## 2018-07-28 DIAGNOSIS — H8109 Meniere's disease, unspecified ear: Secondary | ICD-10-CM

## 2018-07-28 DIAGNOSIS — Z7984 Long term (current) use of oral hypoglycemic drugs: Secondary | ICD-10-CM

## 2018-07-28 DIAGNOSIS — Z96649 Presence of unspecified artificial hip joint: Secondary | ICD-10-CM

## 2018-07-28 DIAGNOSIS — B20 Human immunodeficiency virus [HIV] disease: Secondary | ICD-10-CM

## 2018-07-28 DIAGNOSIS — D649 Anemia, unspecified: Secondary | ICD-10-CM

## 2018-07-28 DIAGNOSIS — Z79899 Other long term (current) drug therapy: Secondary | ICD-10-CM

## 2018-08-01 DIAGNOSIS — D509 Iron deficiency anemia, unspecified: Secondary | ICD-10-CM

## 2018-09-01 DIAGNOSIS — D509 Iron deficiency anemia, unspecified: Secondary | ICD-10-CM

## 2019-03-02 DIAGNOSIS — D509 Iron deficiency anemia, unspecified: Secondary | ICD-10-CM

## 2019-11-12 DIAGNOSIS — D649 Anemia, unspecified: Secondary | ICD-10-CM

## 2020-05-06 ENCOUNTER — Other Ambulatory Visit: Payer: Self-pay | Admitting: Hematology and Oncology

## 2020-05-06 DIAGNOSIS — D649 Anemia, unspecified: Secondary | ICD-10-CM

## 2020-05-13 ENCOUNTER — Telehealth: Payer: Self-pay | Admitting: Oncology

## 2020-05-13 NOTE — Telephone Encounter (Signed)
[place] to Rescheduled from 11/8 Labs, Follow up to 11/30

## 2020-05-16 ENCOUNTER — Ambulatory Visit: Payer: BC Managed Care – PPO | Admitting: Oncology

## 2020-05-16 ENCOUNTER — Other Ambulatory Visit: Payer: BC Managed Care – PPO

## 2020-06-06 ENCOUNTER — Other Ambulatory Visit: Payer: Self-pay | Admitting: Oncology

## 2020-06-06 DIAGNOSIS — D649 Anemia, unspecified: Secondary | ICD-10-CM

## 2020-06-06 NOTE — Progress Notes (Signed)
[facility]  [address] [place],  [place]  [address] [phone]  Clinic Day:  06/07/2020  Referring physician: Philemon Kingdom, MD   HISTORY OF PRESENT ILLNESS:  The patient is a 59 y.o. female with mild anemia.  Labs done earlier this year showed borderline low iron levels to where I recommended that she take 1-2 iron tablets daily.  She comes in today to reassess her anemia.  Since her last visit, the patient has been doing well.  She has baseline fatigue, but denies having any overt forms of blood which concern her for progressive anemia.  PHYSICAL EXAM:  Blood pressure 128/60, pulse 73, temperature 98.5 F (36.9 C), resp. rate 16, height 5\' 10"  (1.778 m), weight 178 lb 6.4 oz (80.9 kg), SpO2 95 %. Wt Readings from Last 3 Encounters:  06/07/20 178 lb 6.4 oz (80.9 kg)   Body mass index is 25.6 kg/m. Performance status (ECOG): 0 Physical Exam Constitutional:      Appearance: Normal appearance. She is not ill-appearing.  HENT:     Mouth/Throat:     Mouth: Mucous membranes are moist.     Pharynx: Oropharynx is clear. No oropharyngeal exudate or posterior oropharyngeal erythema.  Cardiovascular:     Rate and Rhythm: Normal rate and regular rhythm.     Heart sounds: No murmur heard.  No friction rub. No gallop.   Pulmonary:     Effort: Pulmonary effort is normal. No respiratory distress.     Breath sounds: Normal breath sounds. No wheezing, rhonchi or rales.  Abdominal:     General: Bowel sounds are normal. There is no distension.     Palpations: Abdomen is soft. There is no mass.     Tenderness: There is no abdominal tenderness.  Musculoskeletal:        General: No swelling.     Right lower leg: No edema.     Left lower leg: No edema.  Lymphadenopathy:     Cervical: No cervical adenopathy.     Upper Body:     Right upper body: No supraclavicular or axillary adenopathy.     Left upper body: No supraclavicular or axillary  adenopathy.     Lower Body: No right inguinal adenopathy. No left inguinal adenopathy.  Skin:    General: Skin is warm.     Coloration: Skin is not jaundiced.     Findings: No lesion or rash.  Neurological:     General: No focal deficit present.     Mental Status: She is alert and oriented to person, place, and time. Mental status is at baseline.     Cranial Nerves: Cranial nerves are intact.  Psychiatric:        Mood and Affect: Mood normal.        Behavior: Behavior normal.        Thought Content: Thought content normal.     LABS:   CBC Latest Ref Rng & Units 06/07/2020 05/03/2011 05/02/2011  WBC - 6.4 11.5(H) 10.4  Hemoglobin 12.0 - 16.0 11.1(A) 9.7(L) 9.7(L)  Hematocrit 36 - 46 34(A) 30.6(L) 30.1(L)  Platelets 150 - 399 373 331 314   CMP Latest Ref Rng & Units 06/07/2020 05/03/2011 05/02/2011  Glucose 70 - 99 mg/dL - 05/04/2011) 914(N)  BUN 4 - 21 13 4(L) 6  Creatinine 0.5 - 1.1 1.0 0.67 0.72  Sodium 137 - 147 138 136 136  Potassium 3.4 - 5.3 3.6 3.2(L) 4.0  Chloride 99 - 108 99 95(L)  100  CO2 13 - 22 33(A) 34(H) 31  Calcium 8.7 - 10.7 9.5 8.6 8.8  Alkaline Phos 25 - 125 102 - -  AST 13 - 35 25 - -  ALT 7 - 35 20 - -       ASSESSMENT & PLAN:   Assessment/Plan:  A 59 y.o. female with mild anemia, with borderline low iron levels being seen in the past.  Based upon her labs today, there have been no significant changes in her hematologic parameters.  Clinically, she is doing well.  I will see her back in 6 months for repeat clinical assessment.  The patient understands all the plans discussed today and is in agreement with them.      Leela Vanbrocklin Kirby Funk, MD

## 2020-06-07 ENCOUNTER — Inpatient Hospital Stay (INDEPENDENT_AMBULATORY_CARE_PROVIDER_SITE_OTHER): Payer: BC Managed Care – PPO | Admitting: Oncology

## 2020-06-07 ENCOUNTER — Other Ambulatory Visit: Payer: Self-pay

## 2020-06-07 ENCOUNTER — Other Ambulatory Visit: Payer: Self-pay | Admitting: Oncology

## 2020-06-07 ENCOUNTER — Other Ambulatory Visit: Payer: Self-pay | Admitting: Hematology and Oncology

## 2020-06-07 ENCOUNTER — Encounter: Payer: Self-pay | Admitting: Oncology

## 2020-06-07 ENCOUNTER — Inpatient Hospital Stay: Payer: BC Managed Care – PPO | Attending: Oncology

## 2020-06-07 ENCOUNTER — Telehealth: Payer: Self-pay | Admitting: Oncology

## 2020-06-07 VITALS — BP 128/60 | HR 73 | Temp 98.5°F | Resp 16 | Ht 70.0 in | Wt 178.4 lb

## 2020-06-07 DIAGNOSIS — D649 Anemia, unspecified: Secondary | ICD-10-CM | POA: Diagnosis not present

## 2020-06-07 LAB — BASIC METABOLIC PANEL
BUN: 13 (ref 4–21)
CO2: 33 — AB (ref 13–22)
Chloride: 99 (ref 99–108)
Creatinine: 1 (ref 0.5–1.1)
Glucose: 163
Potassium: 3.6 (ref 3.4–5.3)
Sodium: 138 (ref 137–147)

## 2020-06-07 LAB — HEPATIC FUNCTION PANEL
ALT: 20 (ref 7–35)
AST: 25 (ref 13–35)
Alkaline Phosphatase: 102 (ref 25–125)
Bilirubin, Total: 0.3

## 2020-06-07 LAB — COMPREHENSIVE METABOLIC PANEL
Albumin: 4.1 (ref 3.5–5.0)
Calcium: 9.5 (ref 8.7–10.7)

## 2020-06-07 LAB — CBC AND DIFFERENTIAL
HCT: 34 — AB (ref 36–46)
Hemoglobin: 11.1 — AB (ref 12.0–16.0)
Neutrophils Absolute: 3.14
Platelets: 373 (ref 150–399)
WBC: 6.4

## 2020-06-07 LAB — CBC
MCV: 93 (ref 76–111)
RBC: 3.59 — AB (ref 3.87–5.11)

## 2020-06-07 NOTE — Telephone Encounter (Signed)
Per 11/30 LOS, patient scheduled for May 2022 Appt's.  Gave Patient Appt Summary

## 2020-07-06 ENCOUNTER — Encounter: Payer: Self-pay | Admitting: Oncology

## 2020-11-29 NOTE — Progress Notes (Incomplete)
[facility]  [address] [place],  [place]  [address] [phone]  Clinic Day:  11/29/2020  Referring physician: Philemon Kingdom, MD  This document serves as a record of services personally performed by Weston Settle, MD. It was created on their behalf by Curry,Lauren E, a trained medical scribe. The creation of this record is based on the scribe's personal observations and the provider's statements to them.  HISTORY OF PRESENT ILLNESS:  The patient is a 60 y.o. female with mild anemia.  Labs done earlier this year showed borderline low iron levels to where I recommended that she take 1-2 iron tablets daily.  She comes in today to reassess her anemia.  Since her last visit, the patient has been doing well.  She has baseline fatigue, but denies having any overt forms of blood which concern her for progressive anemia.  PHYSICAL EXAM:  There were no vitals taken for this visit. Wt Readings from Last 3 Encounters:  06/07/20 178 lb 6.4 oz (80.9 kg)   There is no height or weight on file to calculate BMI. Performance status (ECOG): 0 Physical Exam Constitutional:      Appearance: Normal appearance. She is not ill-appearing.  HENT:     Mouth/Throat:     Mouth: Mucous membranes are moist.     Pharynx: Oropharynx is clear. No oropharyngeal exudate or posterior oropharyngeal erythema.  Cardiovascular:     Rate and Rhythm: Normal rate and regular rhythm.     Heart sounds: No murmur heard. No friction rub. No gallop.   Pulmonary:     Effort: Pulmonary effort is normal. No respiratory distress.     Breath sounds: Normal breath sounds. No wheezing, rhonchi or rales.  Chest:  Breasts:     Right: No axillary adenopathy or supraclavicular adenopathy.     Left: No axillary adenopathy or supraclavicular adenopathy.    Abdominal:     General: Bowel sounds are normal. There is no distension.     Palpations: Abdomen is soft. There is no mass.      Tenderness: There is no abdominal tenderness.  Musculoskeletal:        General: No swelling.     Right lower leg: No edema.     Left lower leg: No edema.  Lymphadenopathy:     Cervical: No cervical adenopathy.     Upper Body:     Right upper body: No supraclavicular or axillary adenopathy.     Left upper body: No supraclavicular or axillary adenopathy.     Lower Body: No right inguinal adenopathy. No left inguinal adenopathy.  Skin:    General: Skin is warm.     Coloration: Skin is not jaundiced.     Findings: No lesion or rash.  Neurological:     General: No focal deficit present.     Mental Status: She is alert and oriented to person, place, and time. Mental status is at baseline.     Cranial Nerves: Cranial nerves are intact.  Psychiatric:        Mood and Affect: Mood normal.        Behavior: Behavior normal.        Thought Content: Thought content normal.     LABS:   CBC Latest Ref Rng & Units 06/07/2020 05/03/2011 05/02/2011  WBC - 6.4 11.5(H) 10.4  Hemoglobin 12.0 - 16.0 11.1(A) 9.7(L) 9.7(L)  Hematocrit 36 - 46 34(A) 30.6(L) 30.1(L)  Platelets 150 - 399 373 331 314  CMP Latest Ref Rng & Units 06/07/2020 05/03/2011 05/02/2011  Glucose 70 - 99 mg/dL - 376(E) 831(D)  BUN 4 - 21 13 4(L) 6  Creatinine 0.5 - 1.1 1.0 0.67 0.72  Sodium 137 - 147 138 136 136  Potassium 3.4 - 5.3 3.6 3.2(L) 4.0  Chloride 99 - 108 99 95(L) 100  CO2 13 - 22 33(A) 34(H) 31  Calcium 8.7 - 10.7 9.5 8.6 8.8  Alkaline Phos 25 - 125 102 - -  AST 13 - 35 25 - -  ALT 7 - 35 20 - -      ASSESSMENT & PLAN:   Assessment/Plan:  A 60 y.o. female with mild anemia, with borderline low iron levels being seen in the past.  Based upon her labs today, there have been no significant changes in her hematologic parameters.  Clinically, she is doing well.  I will see her back in 6 months for repeat clinical assessment.  The patient understands all the plans discussed today and is in agreement with them.      I, Foye Deer, am acting as scribe for Weston Settle, MD    I have reviewed this report as typed by the medical scribe, and it is complete and accurate.  Dequincy Kirby Funk, MD

## 2020-12-02 ENCOUNTER — Inpatient Hospital Stay: Payer: BC Managed Care – PPO | Attending: Oncology

## 2020-12-02 ENCOUNTER — Inpatient Hospital Stay: Payer: BC Managed Care – PPO | Admitting: Oncology

## 2020-12-28 ENCOUNTER — Other Ambulatory Visit: Payer: Self-pay | Admitting: Oncology

## 2020-12-28 DIAGNOSIS — D649 Anemia, unspecified: Secondary | ICD-10-CM

## 2020-12-28 NOTE — Progress Notes (Signed)
[facility]  [address] [place],  [place]  [address] [phone]  Clinic Day:  12/28/2020  Referring physician: Philemon Kingdom, MD   HISTORY OF PRESENT ILLNESS:  The patient is a 60 y.o. female with mild anemia.  Labs done earlier this year showed borderline low iron levels to where I recommended that she take 1-2 iron tablets daily.  She comes in today to reassess her anemia.  Since her last visit, the patient has been doing well.  She has baseline fatigue, but denies having any overt forms of blood which concern her for progressive anemia.  PHYSICAL EXAM:  There were no vitals taken for this visit. Wt Readings from Last 3 Encounters:  06/07/20 178 lb 6.4 oz (80.9 kg)   There is no height or weight on file to calculate BMI. Performance status (ECOG): 0 Physical Exam Constitutional:      Appearance: Normal appearance. She is not ill-appearing.  HENT:     Mouth/Throat:     Mouth: Mucous membranes are moist.     Pharynx: Oropharynx is clear. No oropharyngeal exudate or posterior oropharyngeal erythema.  Cardiovascular:     Rate and Rhythm: Normal rate and regular rhythm.     Heart sounds: No murmur heard.   No friction rub. No gallop.  Pulmonary:     Effort: Pulmonary effort is normal. No respiratory distress.     Breath sounds: Normal breath sounds. No wheezing, rhonchi or rales.  Chest:  Breasts:    Right: No axillary adenopathy or supraclavicular adenopathy.     Left: No axillary adenopathy or supraclavicular adenopathy.  Abdominal:     General: Bowel sounds are normal. There is no distension.     Palpations: Abdomen is soft. There is no mass.     Tenderness: There is no abdominal tenderness.  Musculoskeletal:        General: No swelling.     Right lower leg: No edema.     Left lower leg: No edema.  Lymphadenopathy:     Cervical: No cervical adenopathy.     Upper Body:     Right upper body: No supraclavicular or axillary  adenopathy.     Left upper body: No supraclavicular or axillary adenopathy.     Lower Body: No right inguinal adenopathy. No left inguinal adenopathy.  Skin:    General: Skin is warm.     Coloration: Skin is not jaundiced.     Findings: No lesion or rash.  Neurological:     General: No focal deficit present.     Mental Status: She is alert and oriented to person, place, and time. Mental status is at baseline.     Cranial Nerves: Cranial nerves are intact.  Psychiatric:        Mood and Affect: Mood normal.        Behavior: Behavior normal.        Thought Content: Thought content normal.   LABS:     Ref. Range 12/29/2020 14:36  Iron Latest Ref Range: 28 - 170 ug/dL 44  UIBC Latest Units: ug/dL 614  TIBC Latest Ref Range: 250 - 450 ug/dL 431  Saturation Ratios Latest Ref Range: 10.4 - 31.8 % 13  Ferritin Latest Ref Range: 11 - 307 ng/mL 24   ASSESSMENT & PLAN:  Assessment/Plan:  A 60 y.o. female with mild anemia, with borderline low iron levels being seen in the past.  Based upon her labs today, there have been no significant changes  in her hematologic parameters.  In fact, her iron parameters are better.  I have no problem with her continuing to take iron by mouth.  Clinically, she is doing well.  I will see her back in 6 months for repeat clinical assessment.  The patient understands all the plans discussed today and is in agreement with them.      Jazmene Racz Kirby Funk, MD

## 2020-12-29 ENCOUNTER — Encounter: Payer: Self-pay | Admitting: Oncology

## 2020-12-29 ENCOUNTER — Other Ambulatory Visit: Payer: Self-pay | Admitting: Oncology

## 2020-12-29 ENCOUNTER — Other Ambulatory Visit: Payer: Self-pay

## 2020-12-29 ENCOUNTER — Inpatient Hospital Stay: Payer: BC Managed Care – PPO | Attending: Oncology | Admitting: Oncology

## 2020-12-29 ENCOUNTER — Inpatient Hospital Stay: Payer: BC Managed Care – PPO

## 2020-12-29 DIAGNOSIS — D508 Other iron deficiency anemias: Secondary | ICD-10-CM

## 2020-12-29 DIAGNOSIS — D649 Anemia, unspecified: Secondary | ICD-10-CM

## 2020-12-29 LAB — COMPREHENSIVE METABOLIC PANEL
Albumin: 4.4 (ref 3.5–5.0)
Calcium: 9.6 (ref 8.7–10.7)

## 2020-12-29 LAB — BASIC METABOLIC PANEL
BUN: 10 (ref 4–21)
CO2: 35 — AB (ref 13–22)
Chloride: 95 — AB (ref 99–108)
Creatinine: 0.9 (ref 0.5–1.1)
Glucose: 163
Potassium: 3.8 (ref 3.4–5.3)
Sodium: 138 (ref 137–147)

## 2020-12-29 LAB — CBC AND DIFFERENTIAL
HCT: 35 — AB (ref 36–46)
Hemoglobin: 11.8 — AB (ref 12.0–16.0)
Neutrophils Absolute: 4.51
Platelets: 373 (ref 150–399)
WBC: 8.5

## 2020-12-29 LAB — IRON AND TIBC
Iron: 44 ug/dL (ref 28–170)
Saturation Ratios: 13 % (ref 10.4–31.8)
TIBC: 341 ug/dL (ref 250–450)
UIBC: 297 ug/dL

## 2020-12-29 LAB — CBC: RBC: 3.66 — AB (ref 3.87–5.11)

## 2020-12-29 LAB — HEPATIC FUNCTION PANEL
ALT: 27 (ref 7–35)
AST: 28 (ref 13–35)
Alkaline Phosphatase: 117 (ref 25–125)
Bilirubin, Total: 0.2

## 2020-12-29 LAB — FERRITIN: Ferritin: 24 ng/mL (ref 11–307)

## 2021-01-04 DIAGNOSIS — D509 Iron deficiency anemia, unspecified: Secondary | ICD-10-CM | POA: Insufficient documentation

## 2021-06-20 NOTE — Progress Notes (Signed)
[facility]  [address] [place],  [place]  [address] [phone]  Clinic Day:  06/29/2021  Referring physician: Philemon Kingdom, MD  This document serves as a record of services personally performed by Weston Settle, MD. It was created on their behalf by Curry,Lauren E, a trained medical scribe. The creation of this record is based on the scribe's personal observations and the provider's statements to them.  HISTORY OF PRESENT ILLNESS:  The patient is a 60 y.o. female with mild anemia.  Labs done earlier this year showed borderline low iron levels to where I recommended that she take 1-2 iron tablets daily for a few months.  She comes in today to reassess her anemia.  Since her last visit, the patient has been doing well.  She denies having any fatigue or any overt forms of blood which concern her for progressive anemia.  Of note, the patient has not taken any iron pills for numerous months.  PHYSICAL EXAM:   Blood pressure (!) 117/58, pulse 72, temperature 99.1 F (37.3 C), resp. rate 16, height 5\' 10"  (1.778 m), weight 161 lb 6.4 oz (73.2 kg), SpO2 96 %. Wt Readings from Last 3 Encounters:  06/29/21 161 lb 6.4 oz (73.2 kg)  12/29/20 176 lb 6.4 oz (80 kg)  06/07/20 178 lb 6.4 oz (80.9 kg)   Body mass index is 23.16 kg/m. Performance status (ECOG): 0 Physical Exam Constitutional:      Appearance: Normal appearance. She is not ill-appearing.  HENT:     Mouth/Throat:     Mouth: Mucous membranes are moist.     Pharynx: Oropharynx is clear. No oropharyngeal exudate or posterior oropharyngeal erythema.  Cardiovascular:     Rate and Rhythm: Normal rate and regular rhythm.     Heart sounds: No murmur heard.   No friction rub. No gallop.  Pulmonary:     Effort: Pulmonary effort is normal. No respiratory distress.     Breath sounds: Normal breath sounds. No wheezing, rhonchi or rales.  Abdominal:     General: Bowel sounds are normal. There  is no distension.     Palpations: Abdomen is soft. There is no mass.     Tenderness: There is no abdominal tenderness.  Musculoskeletal:        General: No swelling.     Right lower leg: No edema.     Left lower leg: No edema.  Lymphadenopathy:     Cervical: No cervical adenopathy.     Upper Body:     Right upper body: No supraclavicular or axillary adenopathy.     Left upper body: No supraclavicular or axillary adenopathy.     Lower Body: No right inguinal adenopathy. No left inguinal adenopathy.  Skin:    General: Skin is warm.     Coloration: Skin is not jaundiced.     Findings: No lesion or rash.  Neurological:     General: No focal deficit present.     Mental Status: She is alert and oriented to person, place, and time. Mental status is at baseline.  Psychiatric:        Mood and Affect: Mood normal.        Behavior: Behavior normal.        Thought Content: Thought content normal.   LABS:    Latest Reference Range & Units 06/29/21 00:00  Sodium 137 - 147  141  Potassium 3.4 - 5.3  3.7  Chloride 99 - 108  102  CO2 13 - 22  31 !  Glucose  147  BUN 4 - 21  12  Creatinine 0.5 - 1.1  0.8  Calcium 8.7 - 10.7  9.5  Alkaline Phosphatase 25 - 125  115  Albumin 3.5 - 5.0  4.2  AST 13 - 35  28  ALT 7 - 35  23  Bilirubin, Total  0.5  WBC  9.2  RBC 3.87 - 5.11  3.94  Hemoglobin 12.0 - 16.0  12.2  HCT 36 - 46  36  MCV  92  Platelets 150 - 399  375  NEUT#  4.78     Latest Reference Range & Units 06/29/21 14:06  Iron 28 - 170 ug/dL 58  UIBC ug/dL 482  TIBC 500 - 370 ug/dL 488  Saturation Ratios 10.4 - 31.8 % 17  Ferritin 11 - 307 ng/mL 51   ASSESSMENT & PLAN:  Assessment/Plan:  A 60 y.o. female with mild anemia, with borderline low iron levels being seen in the past.  I am pleased that both her iron and hemoglobin levels are better today than what they have been previously.  These improved labs are also in the setting of her not being on any iron over these past  months.   Clinically, the patient is doing well.  As she no longer has any significant issues with anemia, I do feel comfortable turning her care back over to her primary care office with the recommendation that her CBC be checked 1-2 times per year.  I would not have a problem seeing her in the future if new hematologic issues arise that require repeat clinical assessment.  The patient understands all the plans discussed today and is in agreement with them.  I, Foye Deer, am acting as scribe for Weston Settle, MD    I have reviewed this report as typed by the medical scribe, and it is complete and accurate.  Nayleah Gamel Kirby Funk, MD

## 2021-06-29 ENCOUNTER — Inpatient Hospital Stay: Payer: BC Managed Care – PPO | Attending: Oncology | Admitting: Oncology

## 2021-06-29 ENCOUNTER — Telehealth: Payer: Self-pay | Admitting: Oncology

## 2021-06-29 ENCOUNTER — Inpatient Hospital Stay: Payer: BC Managed Care – PPO

## 2021-06-29 ENCOUNTER — Encounter: Payer: Self-pay | Admitting: Oncology

## 2021-06-29 VITALS — BP 117/58 | HR 72 | Temp 99.1°F | Resp 16 | Ht 70.0 in | Wt 161.4 lb

## 2021-06-29 DIAGNOSIS — D508 Other iron deficiency anemias: Secondary | ICD-10-CM | POA: Diagnosis not present

## 2021-06-29 DIAGNOSIS — D649 Anemia, unspecified: Secondary | ICD-10-CM

## 2021-06-29 LAB — CBC AND DIFFERENTIAL
HCT: 36 (ref 36–46)
Hemoglobin: 12.2 (ref 12.0–16.0)
Neutrophils Absolute: 4.78
Platelets: 375 (ref 150–399)
WBC: 9.2

## 2021-06-29 LAB — IRON AND TIBC
Iron: 58 ug/dL (ref 28–170)
Saturation Ratios: 17 % (ref 10.4–31.8)
TIBC: 332 ug/dL (ref 250–450)
UIBC: 274 ug/dL

## 2021-06-29 LAB — BASIC METABOLIC PANEL
BUN: 12 (ref 4–21)
CO2: 31 — AB (ref 13–22)
Chloride: 102 (ref 99–108)
Creatinine: 0.8 (ref 0.5–1.1)
Glucose: 147
Potassium: 3.7 (ref 3.4–5.3)
Sodium: 141 (ref 137–147)

## 2021-06-29 LAB — CBC: RBC: 3.94 (ref 3.87–5.11)

## 2021-06-29 LAB — HEPATIC FUNCTION PANEL
ALT: 23 (ref 7–35)
AST: 28 (ref 13–35)
Alkaline Phosphatase: 115 (ref 25–125)
Bilirubin, Total: 0.5

## 2021-06-29 LAB — FERRITIN: Ferritin: 51 ng/mL (ref 11–307)

## 2021-06-29 LAB — COMPREHENSIVE METABOLIC PANEL
Albumin: 4.2 (ref 3.5–5.0)
Calcium: 9.5 (ref 8.7–10.7)

## 2021-06-29 NOTE — Telephone Encounter (Signed)
Per 12/22 LOS, patient scheduled for June 2023 Appt's.  Gave patient Appt Summary/Appt Calendar

## 2021-06-30 ENCOUNTER — Telehealth: Payer: Self-pay | Admitting: Oncology

## 2021-06-30 NOTE — Telephone Encounter (Signed)
Patient Canceled June Appt's Lab Work Results are normal.  Dr Melvyn Neth released her from Clinic

## 2021-12-28 ENCOUNTER — Other Ambulatory Visit: Payer: BC Managed Care – PPO

## 2021-12-28 ENCOUNTER — Ambulatory Visit: Payer: BC Managed Care – PPO | Admitting: Oncology
